# Patient Record
Sex: Male | Born: 1975
Health system: Southern US, Community
[De-identification: ages and names within clinical notes are randomized; demographics above are authoritative.]

---

## 2005-07-03 ENCOUNTER — Emergency Department: Payer: Self-pay | Admitting: Emergency Medicine

## 2006-12-18 ENCOUNTER — Ambulatory Visit: Payer: Self-pay | Admitting: Internal Medicine

## 2006-12-18 LAB — CONVERTED CEMR LAB
ALT: 30 units/L (ref 0–40)
Albumin: 4 g/dL (ref 3.5–5.2)
Alkaline Phosphatase: 50 units/L (ref 39–117)
BUN: 10 mg/dL (ref 6–23)
Basophils Absolute: 0.1 10*3/uL (ref 0.0–0.1)
Basophils Relative: 1 % (ref 0.0–1.0)
Bilirubin Urine: NEGATIVE
Chloride: 105 meq/L (ref 96–112)
Creatinine, Ser: 0.9 mg/dL (ref 0.4–1.5)
Eosinophils Absolute: 0.1 10*3/uL (ref 0.0–0.6)
GFR calc Af Amer: 127 mL/min
GFR calc non Af Amer: 105 mL/min
HDL: 38.3 mg/dL — ABNORMAL LOW (ref >39.0)
Hemoglobin: 16.4 g/dL (ref 13.0–17.0)
LDL Cholesterol: 130 mg/dL — ABNORMAL HIGH (ref 0–99)
Lymphocytes Relative: 16.5 % (ref 12.0–46.0)
MCHC: 35.4 g/dL (ref 30.0–36.0)
Neutrophils Relative %: 74.4 % (ref 43.0–77.0)
Nitrite: NEGATIVE
TSH: 1.08 microintl units/mL (ref 0.35–5.50)
Total CHOL/HDL Ratio: 4.8
Triglycerides: 82 mg/dL (ref 0–149)
Urine Glucose: NEGATIVE mg/dL
VLDL: 16 mg/dL (ref 0–40)
WBC: 7.7 10*3/uL (ref 4.5–10.5)

## 2007-01-01 ENCOUNTER — Emergency Department (HOSPITAL_COMMUNITY): Admission: EM | Admit: 2007-01-01 | Discharge: 2007-01-01 | Payer: Self-pay | Admitting: Emergency Medicine

## 2007-01-28 ENCOUNTER — Ambulatory Visit: Payer: Self-pay | Admitting: Internal Medicine

## 2007-01-29 ENCOUNTER — Encounter: Payer: Self-pay | Admitting: Family Medicine

## 2007-08-05 ENCOUNTER — Encounter: Payer: Self-pay | Admitting: *Deleted

## 2007-12-03 ENCOUNTER — Ambulatory Visit: Payer: Self-pay | Admitting: Internal Medicine

## 2007-12-03 DIAGNOSIS — J385 Laryngeal spasm: Secondary | ICD-10-CM | POA: Insufficient documentation

## 2007-12-03 DIAGNOSIS — J069 Acute upper respiratory infection, unspecified: Secondary | ICD-10-CM | POA: Insufficient documentation

## 2007-12-09 ENCOUNTER — Encounter: Payer: Self-pay | Admitting: Internal Medicine

## 2011-01-10 ENCOUNTER — Encounter: Payer: Self-pay | Admitting: Family Medicine

## 2014-02-06 ENCOUNTER — Ambulatory Visit: Payer: Self-pay | Admitting: Physician Assistant

## 2014-02-06 LAB — RAPID INFLUENZA A&B ANTIGENS

## 2014-10-07 ENCOUNTER — Ambulatory Visit: Payer: Self-pay | Admitting: Family Medicine

## 2014-10-07 LAB — CBC WITH DIFFERENTIAL/PLATELET
BASOS PCT: 0.9 %
Basophil #: 0.1 10*3/uL (ref 0.0–0.1)
EOS ABS: 0 10*3/uL (ref 0.0–0.7)
Eosinophil %: 0.4 %
HCT: 46.9 % (ref 40.0–52.0)
HGB: 15.8 g/dL (ref 13.0–18.0)
LYMPHS ABS: 1.7 10*3/uL (ref 1.0–3.6)
LYMPHS PCT: 16.4 %
MCH: 30.1 pg (ref 26.0–34.0)
MCHC: 33.8 g/dL (ref 32.0–36.0)
MCV: 89 fL (ref 80–100)
MONOS PCT: 8.6 %
Monocyte #: 0.9 x10 3/mm (ref 0.2–1.0)
NEUTROS ABS: 7.6 10*3/uL — AB (ref 1.4–6.5)
NEUTROS PCT: 73.7 %
Platelet: 457 10*3/uL — ABNORMAL HIGH (ref 150–440)
RBC: 5.26 10*6/uL (ref 4.40–5.90)
RDW: 13.6 % (ref 11.5–14.5)
WBC: 10.3 10*3/uL (ref 3.8–10.6)

## 2014-10-07 LAB — BASIC METABOLIC PANEL
ANION GAP: 9 (ref 7–16)
BUN: 10 mg/dL (ref 7–18)
CHLORIDE: 104 mmol/L (ref 98–107)
CO2: 28 mmol/L (ref 21–32)
Calcium, Total: 9.3 mg/dL (ref 8.5–10.1)
Creatinine: 1.16 mg/dL (ref 0.60–1.30)
EGFR (Non-African Amer.): >60
GLUCOSE: 106 mg/dL — AB (ref 65–99)
OSMOLALITY: 281 (ref 275–301)
POTASSIUM: 4.1 mmol/L (ref 3.5–5.1)
SODIUM: 141 mmol/L (ref 136–145)

## 2014-10-07 LAB — TSH: Thyroid Stimulating Horm: 1.2 u[IU]/mL

## 2014-10-12 ENCOUNTER — Ambulatory Visit: Payer: Self-pay | Admitting: Family Medicine

## 2016-01-02 MED FILL — LISINOPRIL 10 MG TABLET: 10 | 30 days supply | Qty: 30 | Fill #0

## 2016-02-13 MED FILL — LISINOPRIL 10 MG TABLET: 10 | 30 days supply | Qty: 30 | Fill #0

## 2016-03-06 DIAGNOSIS — I10 Essential (primary) hypertension: Secondary | ICD-10-CM | POA: Diagnosis not present

## 2016-03-06 DIAGNOSIS — Z0000 Encounter for general adult medical examination without abnormal findings: Secondary | ICD-10-CM | POA: Diagnosis not present

## 2016-03-12 DIAGNOSIS — Z0000 Encounter for general adult medical examination without abnormal findings: Secondary | ICD-10-CM | POA: Diagnosis not present

## 2016-03-12 MED FILL — LISINOPRIL 10 MG TABLET: 10 | 90 days supply | Qty: 90 | Fill #0

## 2016-03-29 DIAGNOSIS — H52223 Regular astigmatism, bilateral: Secondary | ICD-10-CM | POA: Diagnosis not present

## 2016-06-18 MED FILL — LISINOPRIL 10 MG TABLET: 10 | 90 days supply | Qty: 90 | Fill #1

## 2016-09-27 MED FILL — LISINOPRIL 10 MG TABLET: 10 | 90 days supply | Qty: 90 | Fill #2

## 2017-01-11 MED FILL — LISINOPRIL 10 MG TABLET: 10 | 90 days supply | Qty: 90 | Fill #3

## 2017-02-19 DIAGNOSIS — H168 Other keratitis: Secondary | ICD-10-CM | POA: Diagnosis not present

## 2017-04-15 MED FILL — LISINOPRIL 10 MG TABLET: 10 | 30 days supply | Qty: 30 | Fill #0

## 2017-05-23 MED FILL — LISINOPRIL 10 MG TABLET: 10 | 30 days supply | Qty: 30 | Fill #1

## 2017-06-23 MED FILL — LISINOPRIL 10 MG TABLET: 10 | 30 days supply | Qty: 30 | Fill #2

## 2017-06-26 DIAGNOSIS — Z0000 Encounter for general adult medical examination without abnormal findings: Secondary | ICD-10-CM | POA: Diagnosis not present

## 2017-06-26 DIAGNOSIS — I10 Essential (primary) hypertension: Secondary | ICD-10-CM | POA: Diagnosis not present

## 2017-08-05 MED FILL — LISINOPRIL 10 MG TABS: 10 | 90 days supply | Qty: 90 | Fill #0

## 2017-11-18 MED FILL — LISINOPRIL 10 MG TABS: 10 | 90 days supply | Qty: 90 | Fill #1

## 2018-02-20 MED FILL — LISINOPRIL 10 MG TABS: 10 | 90 days supply | Qty: 90 | Fill #2

## 2018-05-26 MED FILL — LISINOPRIL 10 MG TABLET: 10 | 90 days supply | Qty: 90 | Fill #3

## 2018-09-04 MED FILL — LISINOPRIL 10 MG TABS: 10 | 90 days supply | Qty: 90 | Fill #0

## 2018-12-08 MED FILL — LISINOPRIL 10 MG TABLET: 10 | 90 days supply | Qty: 90 | Fill #0

## 2019-03-12 DIAGNOSIS — Z0000 Encounter for general adult medical examination without abnormal findings: Secondary | ICD-10-CM | POA: Diagnosis not present

## 2019-03-12 DIAGNOSIS — Z125 Encounter for screening for malignant neoplasm of prostate: Secondary | ICD-10-CM | POA: Diagnosis not present

## 2019-03-18 DIAGNOSIS — I10 Essential (primary) hypertension: Secondary | ICD-10-CM | POA: Diagnosis not present

## 2019-03-27 MED FILL — LISINOPRIL 10 MG TABLET: 10 | 90 days supply | Qty: 90 | Fill #0

## 2019-07-27 DIAGNOSIS — Z0000 Encounter for general adult medical examination without abnormal findings: Secondary | ICD-10-CM | POA: Diagnosis not present

## 2019-07-27 DIAGNOSIS — I10 Essential (primary) hypertension: Secondary | ICD-10-CM | POA: Diagnosis not present

## 2019-09-07 DIAGNOSIS — I10 Essential (primary) hypertension: Secondary | ICD-10-CM | POA: Diagnosis not present

## 2019-09-07 DIAGNOSIS — Z0000 Encounter for general adult medical examination without abnormal findings: Secondary | ICD-10-CM | POA: Diagnosis not present

## 2020-11-07 ENCOUNTER — Other Ambulatory Visit: Payer: Self-pay | Admitting: Family Medicine

## 2020-12-07 DIAGNOSIS — Z125 Encounter for screening for malignant neoplasm of prostate: Secondary | ICD-10-CM | POA: Diagnosis not present

## 2020-12-07 DIAGNOSIS — Z13220 Encounter for screening for lipoid disorders: Secondary | ICD-10-CM | POA: Diagnosis not present

## 2020-12-07 DIAGNOSIS — Z0000 Encounter for general adult medical examination without abnormal findings: Secondary | ICD-10-CM | POA: Diagnosis not present

## 2020-12-14 ENCOUNTER — Other Ambulatory Visit: Payer: Self-pay | Admitting: Family Medicine

## 2020-12-14 DIAGNOSIS — I10 Essential (primary) hypertension: Secondary | ICD-10-CM | POA: Diagnosis not present

## 2020-12-14 DIAGNOSIS — Z0000 Encounter for general adult medical examination without abnormal findings: Secondary | ICD-10-CM | POA: Diagnosis not present

## 2020-12-21 DIAGNOSIS — Z20822 Contact with and (suspected) exposure to covid-19: Secondary | ICD-10-CM | POA: Diagnosis not present

## 2020-12-21 DIAGNOSIS — U071 COVID-19: Secondary | ICD-10-CM | POA: Diagnosis not present

## 2021-02-07 ENCOUNTER — Other Ambulatory Visit: Payer: Self-pay

## 2021-02-07 MED FILL — Lisinopril Tab 20 MG: ORAL | 90 days supply | Qty: 90 | Fill #0 | Status: AC

## 2021-06-08 MED FILL — Lisinopril Tab 20 MG: ORAL | 90 days supply | Qty: 90 | Fill #1 | Status: AC

## 2021-06-09 ENCOUNTER — Other Ambulatory Visit: Payer: Self-pay

## 2021-07-12 NOTE — Progress Notes (Signed)
Subjective:    CC: L knee pain  I, Jacob Arias, LAT, ATC, am serving as scribe for Dr. Clementeen Graham.  HPI: Pt is a 45 y/o male presenting w/ L knee pain ongoing since after playing soccer w/ his kids that has never full gotten better. Pt made a "cut" this weekend and experienced increased pain. He locates his pain to the anterior-medial aspect and sometimes the posterior aspect of the L knee and into calf.  Knee swelling: no Knee mechanical symptoms: yes Aggravating factors: deep flexion, stairs, transitioning from sitting to standing Treatments tried: ice, IBU, knee brace  Pertinent review of Systems: No fevers or chills  Relevant historical information: History of laryngeal spasm   Objective:    Vitals:   07/13/21 0841  BP: 136/84  Pulse: 86  SpO2: 98%   General: Well Developed, well nourished, and in no acute distress.   MSK: Left knee mild effusion normal motion with crepitation. Tender palpation medial joint line. Stable ligamentous exam. Positive medial McMurray's test. Intact strength.  Lab and Radiology Results  Procedure: Real-time Ultrasound Guided Injection of left knee superior lateral patellar space Device: Philips Affiniti 50G Images permanently stored and available for review in PACS Ultrasound evaluation prior to injection reveals mild effusion.  Intact quad and patellar tendon.  Degenerative appearing medial meniscus.  No Baker's cyst. Verbal informed consent obtained.  Discussed risks and benefits of procedure. Warned about infection bleeding damage to structures skin hypopigmentation and fat atrophy among others. Patient expresses understanding and agreement Time-out conducted.   Noted no overlying erythema, induration, or other signs of local infection.   Skin prepped in a sterile fashion.   Local anesthesia: Topical Ethyl chloride.   With sterile technique and under real time ultrasound guidance: 40 mg of Kenalog and 2 mL of Marcaine  injected into knee joint. Fluid seen entering the joint capsule.   Completed without difficulty   Pain immediately resolved suggesting accurate placement of the medication.   Advised to call if fevers/chills, erythema, induration, drainage, or persistent bleeding.   Images permanently stored and available for review in the ultrasound unit.  Impression: Technically successful ultrasound guided injection.    X-ray left knee images obtained today personally and independently interpreted No acute fractures.  Mild degenerative changes. Await formal radiology review   Impression and Recommendations:    Assessment and Plan: 45 y.o. male with left knee pain occurring since April.  Concerning for degenerative medial meniscus tear.  Plan for steroid injection refer to physical therapy Voltaren gel and compression sleeve.  If not improving within about 6 weeks next step would probably be MRI to further characterize cause of the pain and swelling.  Recheck in about 6 weeks.[name]  PDMP not reviewed this encounter. Orders Placed This Encounter  Procedures   [place] LIMITED JOINT SPACE STRUCTURES LOW LEFT(NO LINKED CHARGES)    Standing Status:   Future    Number of Occurrences:   1    Standing Expiration Date:   01/10/2022    Order Specific Question:   Reason for Exam (SYMPTOM  OR DIAGNOSIS REQUIRED)    Answer:   left knee pain    Order Specific Question:   Preferred imaging location?    Answer:   [facility] Knee AP/LAT W/Sunrise Left    Standing Status:   Future    Number of Occurrences:   1    Standing Expiration Date:   07/13/2022    Order  Specific Question:   Reason for Exam (SYMPTOM  OR DIAGNOSIS REQUIRED)    Answer:   left knee pain    Order Specific Question:   Preferred imaging location?    Answer:   [name]   Ambulatory referral to Physical Therapy    Referral Priority:   Routine    Referral Type:   Physical Medicine    Referral Reason:   Specialty  Services Required    Requested Specialty:   Physical Therapy    Number of Visits Requested:   1   No orders of the defined types were placed in this encounter.   Discussed warning signs or symptoms. Please see discharge instructions. Patient expresses understanding.   The above documentation has been reviewed and is accurate and complete Clementeen Graham, M.D.

## 2021-07-13 ENCOUNTER — Other Ambulatory Visit: Payer: Self-pay

## 2021-07-13 ENCOUNTER — Ambulatory Visit: Payer: Self-pay

## 2021-07-13 ENCOUNTER — Ambulatory Visit (INDEPENDENT_AMBULATORY_CARE_PROVIDER_SITE_OTHER): Payer: 59

## 2021-07-13 ENCOUNTER — Ambulatory Visit (INDEPENDENT_AMBULATORY_CARE_PROVIDER_SITE_OTHER): Payer: 59 | Admitting: Family Medicine

## 2021-07-13 VITALS — BP 136/84 | HR 86 | Ht 74.0 in | Wt 285.8 lb

## 2021-07-13 DIAGNOSIS — M25562 Pain in left knee: Secondary | ICD-10-CM

## 2021-07-13 NOTE — Patient Instructions (Addendum)
Thank you for coming in today.   Please get an Xray today before you leave   I've referred you to Physical Therapy.  Let us know if you don't hear from them in one week.   Call or go to the ER if you develop a large red swollen joint with extreme pain or oozing puss.    Please use Voltaren gel (Generic Diclofenac Gel) up to 4x daily for pain as needed.  This is available over-the-counter as both the name brand Voltaren gel and the generic diclofenac gel.   I recommend you obtained a compression sleeve to help with your joint problems. There are many options on the market however I recommend obtaining a full knee Body Helix compression sleeve.  You can find information (including how to appropriate measure yourself for sizing) can be found at www.[employer].ch.  Many of these products are health savings account (HSA) eligible.   You can use the compression sleeve at any time throughout the day but is most important to use while being active as well as for 2 hours post-activity.   It is appropriate to ice following activity with the compression sleeve in place.    Recheck in about 6 weeks.   Let me know if this is not working.   Next step is probably MRI.  I can order that before you come back if that is obviously what we are doing.

## 2021-07-17 NOTE — Progress Notes (Signed)
Left knee x-ray looks normal to radiology

## 2021-07-21 ENCOUNTER — Other Ambulatory Visit: Payer: Self-pay

## 2021-07-21 ENCOUNTER — Ambulatory Visit: Payer: 59 | Admitting: Physical Therapy

## 2021-07-21 ENCOUNTER — Encounter: Payer: Self-pay | Admitting: Physical Therapy

## 2021-07-21 DIAGNOSIS — M25562 Pain in left knee: Secondary | ICD-10-CM | POA: Diagnosis not present

## 2021-07-21 DIAGNOSIS — R262 Difficulty in walking, not elsewhere classified: Secondary | ICD-10-CM

## 2021-07-21 DIAGNOSIS — M6281 Muscle weakness (generalized): Secondary | ICD-10-CM | POA: Diagnosis not present

## 2021-07-21 NOTE — Patient Instructions (Signed)
Access Code: [email or web address] URL: [email or web address] Date: 07/21/2021 Prepared by: Ivery Quale  Exercises Seated Hamstring Stretch - 2 x daily - 6 x weekly - 1 sets - 3 reps - 30 hold Wall Quarter Squat - 2 x daily - 6 x weekly - 1 sets - 10 reps - 10 sec hold Sit to Stand Without Arm Support - 2 x daily - 6 x weekly - 1-2 sets - 10 reps Sidestepping in Squat with Resistance and Arms Forward - 2 x daily - 6 x weekly - 3 sets - 10 reps Trail Leg Lunge - 2 x daily - 6 x weekly - 2-3 sets - 8-10 reps Single Leg Bridge - 2 x daily - 6 x weekly - 3 sets - 10 reps

## 2021-07-21 NOTE — Therapy (Signed)
[facility] Physical Therapy [address] [place], [place], [address] Phone: [phone]   Fax:  [phone]  Physical Therapy Evaluation  Patient Details  Name: Jacob Arias MRN: [id number] Date of Birth: 1976/10/12 Referring Provider (PT): Denyse Amass, MD   Encounter Date: 07/21/2021   PT End of Session - 07/21/21 1152     Visit Number 1    Number of Visits 6    Date for PT Re-Evaluation 09/01/21    Authorization Type [facility]    PT Start Time 1100    PT Stop Time 1142    PT Time Calculation (min) 42 min    Activity Tolerance Patient tolerated treatment well    Behavior During Therapy [facility] for tasks assessed/performed             History reviewed. No pertinent past medical history.  History reviewed. No pertinent surgical history.  There were no vitals filed for this visit.    Subjective Assessment - 07/21/21 1101     Subjective Pt is a 45 y/o male presenting w/ L knee pain ongoing since april after playing soccer w/ his kids that has never full gotten better. Pt made a "cut" last weekend and experienced increased pain. He locates his pain to the anterior-medial aspect and sometimes the posterior aspect of the L knee and into calf. He did have injection 07/13/21 and now relays he is not having pain at all, but he also has not tested his knee and has not done any activity for fear of the pain coming back.    Diagnostic tests Knee XR negative, knee diagnostic US "mild effusion.  Intact quad and patellar tendon.  Degenerative appearing medial meniscus.  No Baker's cyst."    Currently in Pain? No/denies   but was having severe pain 8-9 before injection   Pain Score 0-No pain    Pain Location Knee    Pain Orientation Left    Pain Type Acute pain    Aggravating Factors  squatting, stairs, prolonged standing or walking    Pain Relieving Factors bike, cortisone injection                [facility] PT Assessment - 07/21/21 0001       Assessment   Medical  Diagnosis Lt knee pain    Referring Provider (PT) Denyse Amass, MD    Onset Date/Surgical Date --   pain since April 2022   Next MD Visit 08/24/21    Prior Therapy none      Precautions   Precautions None      Home Environment   Living Environment Private residence      Prior Function   Level of Independence Independent    Vocation Full time employment    Biomedical scientist, desk work    Leisure golf, hike, soccer with kids      Cognition   Overall Cognitive Status Within Functional Limits for tasks assessed      Observation/Other Assessments   Focus on Therapeutic Outcomes (FOTO)  61% functional score      ROM / Strength   AROM / PROM / Strength AROM;PROM;Strength      AROM   AROM Assessment Site Knee    Right/Left Knee Left;Right    Right Knee Extension 0    Right Knee Flexion 127    Left Knee Extension 0    Left Knee Flexion 125      Strength   Overall Strength Comments bilat hip and knee strength 5/5  except Lt hip extension 4+      Flexibility   Soft Tissue Assessment /Muscle Length --   moderate tight hamstrings, mild tight quads     Special Tests   Other special tests negative special testing for valgus and varus stress tests, anterior-posterior drawer, mcmurrayts test, thessaly test, eges test. No pain with squatting today. +patellar grind test      Ambulation/Gait   Gait Comments WNL, community ambulator                        Objective measurements completed on examination: See above findings.       [facility] Adult PT Treatment/Exercise - 07/21/21 0001       Exercises   Exercises Knee/Hip      Knee/Hip Exercises: Aerobic   Recumbent Bike 8 min L5                     PT Education - 07/21/21 1151     Education Details HEP,POC,exam findings    Person(s) Educated Patient    Methods Explanation;Demonstration;Verbal cues;Handout    Comprehension Verbalized understanding;Need further instruction                  PT Long Term Goals - 07/21/21 1200       PT LONG TERM GOAL #1   Title Pt will improve FOTO to 77% functional score    Time 6    Period Weeks    Status New    Target Date 09/01/21      PT LONG TERM GOAL #2   Title Pt will be able to return to his usual activity and ADL's including golf and soccer with his kids with less than 3/10 overall pain    Time 6    Period Weeks    Status New      PT LONG TERM GOAL #3   Title Pt will be I and compliant with HEP.    Time 6    Period Weeks    Status New                    Plan - 07/21/21 1152     Clinical Impression Statement Pt presents with Lt knee pain from soccer injury while cutting. This appears to have mostly resolved after recieving knee injection last week. He does still lack confidence with his knee and is apprehensive about the pain returning. He would benefit from skilled PT to address this, to build knee stabillity and allow him to slowly return to his normal activity. He will start with HEP and will follow up PT as needed but likely will not need more than one time per 1-2 weeks unless symptoms become worse.    Examination-Activity Limitations Squat;Lift;Stand;Stairs    Examination-Participation Restrictions Community Activity;Yard Work;Other   golf and soccer   Stability/Clinical Decision Making Stable/Uncomplicated    Clinical Decision Making Low    Rehab Potential Excellent    PT Frequency 1x / week    PT Duration 6 weeks    PT Treatment/Interventions ADLs/Self Care Home Management;Cryotherapy;Electrical Stimulation;Iontophoresis 4mg /ml Dexamethasone;Moist Heat;Ultrasound;Therapeutic activities;Therapeutic exercise;Neuromuscular re-education;Patient/family education;Manual techniques;Passive range of motion;Dry needling;Joint Manipulations;Taping;Vasopneumatic Device    PT Next Visit Plan review and progress HEP as able. SL stability, H.S/quad stretching    Consulted and Agree with Plan of Care Patient              Patient will benefit from skilled therapeutic intervention in  order to improve the following deficits and impairments:  Decreased activity tolerance, Decreased strength, Decreased range of motion, Difficulty walking, Impaired flexibility, Pain  Visit Diagnosis: Acute pain of left knee  Difficulty in walking, not elsewhere classified  Muscle weakness (generalized)     Problem List Patient Active Problem List   Diagnosis Date Noted   URI 12/03/2007   LARYNGEAL SPASM 12/03/2007    April Manson, PT,DPT 07/21/2021, 12:02 PM  [facility] Physical Therapy [address] [place], [place], [address] Phone: [phone]   Fax:  [phone]  Name: Jacob Arias MRN: [id number] Date of Birth: 12-10-1975

## 2021-07-25 ENCOUNTER — Ambulatory Visit (HOSPITAL_BASED_OUTPATIENT_CLINIC_OR_DEPARTMENT_OTHER): Payer: Self-pay | Admitting: Orthopaedic Surgery

## 2021-08-07 ENCOUNTER — Other Ambulatory Visit: Payer: Self-pay

## 2021-08-07 ENCOUNTER — Ambulatory Visit: Payer: 59 | Admitting: Physical Therapy

## 2021-08-07 DIAGNOSIS — R262 Difficulty in walking, not elsewhere classified: Secondary | ICD-10-CM

## 2021-08-07 DIAGNOSIS — M25562 Pain in left knee: Secondary | ICD-10-CM

## 2021-08-07 DIAGNOSIS — M6281 Muscle weakness (generalized): Secondary | ICD-10-CM | POA: Diagnosis not present

## 2021-08-07 NOTE — Therapy (Addendum)
[facility] Physical Therapy [address] [place], [place], [address] Phone: [phone]   Fax:  [phone]  Physical Therapy Treatment/Discharge addendum PHYSICAL THERAPY DISCHARGE SUMMARY  Visits from Start of Care: 2  Current functional level related to goals / functional outcomes: See below   Remaining deficits: See below   Education / Equipment: HEP Plan:  Patient goals were not met. Patient is being discharged due to not returning since last visit.      Patient Details  Name: Jacob Arias MRN: [id number] Date of Birth: [birth year] Referring Provider (PT): Georgina Snell, MD   Encounter Date: 08/07/2021   PT End of Session - 08/07/21 1413     Visit Number 2    Number of Visits 6    Date for PT Re-Evaluation 09/01/21    Authorization Type [facility]    PT Start Time 1300    PT Stop Time 1345    PT Time Calculation (min) 45 min    Activity Tolerance Patient tolerated treatment well    Behavior During Therapy [facility] for tasks assessed/performed             No past medical history on file.  No past surgical history on file.  There were no vitals filed for this visit.   Subjective Assessment - 08/07/21 1335     Subjective relays he had to chase down his dog that got loose and this caused the pain to come back but now it is only mild pain overall.    Diagnostic tests Knee XR negative, knee diagnostic US "mild effusion.  Intact quad and patellar tendon.  Degenerative appearing medial meniscus.  No Baker's cyst."    Currently in Pain? Yes    Pain Score 3     Pain Location Knee    Pain Orientation Left                               [facility] Adult PT Treatment/Exercise - 08/07/21 0001       Knee/Hip Exercises: Stretches   Sports administrator Left;20 seconds;4 reps    Sports administrator Limitations supine with strap    Gastroc Stretch Both;3 reps;30 seconds    Gastroc Stretch Limitations slantboard      Knee/Hip Exercises: Aerobic    Recumbent Bike 10 min L5      Knee/Hip Exercises: Machines for Strengthening   Cybex Knee Extension 20# up with both down with Lt only 2X10    Cybex Knee Flexion 45# DL 2X15      Knee/Hip Exercises: Standing   Other Standing Knee Exercises hip abd and marches red band around ankles X20 bilat without UE support    Other Standing Knee Exercises TRX squats with 2 sec hold 2X10, TRX lunges X10 bilat      Knee/Hip Exercises: Seated   Other Seated Knee/Hip Exercises seated SLR 5# 3X10 on Lt                          PT Long Term Goals - 07/21/21 1200       PT LONG TERM GOAL #1   Title Pt will improve FOTO to 77% functional score    Time 6    Period Weeks    Status New    Target Date 09/01/21      PT LONG TERM GOAL #2   Title Pt will be able to return to his usual activity  and ADL's including golf and soccer with his kids with less than 3/10 overall pain    Time 6    Period Weeks    Status New      PT LONG TERM GOAL #3   Title Pt will be I and compliant with HEP.    Time 6    Period Weeks    Status New                   Plan - 08/07/21 1414     Clinical Impression Statement He is seeing some good early progress and has been compliant with HEP. I progressed his strength program and showed him a different quad stretch to do that was more comfortable. He had good overall tolerance to todays exercises and I did recommend a suspension/TRX system for home use. I encouraged him to set up another visit if he wants to so we can progress his home program then.    Examination-Activity Limitations Squat;Lift;Stand;Stairs    Examination-Participation Restrictions Community Activity;Yard Work;Other   golf and soccer   Stability/Clinical Decision Making Stable/Uncomplicated    Rehab Potential Excellent    PT Frequency 1x / week    PT Duration 6 weeks    PT Treatment/Interventions ADLs/Self Care Home Management;Cryotherapy;Electrical Stimulation;Iontophoresis 68m/ml  Dexamethasone;Moist Heat;Ultrasound;Therapeutic activities;Therapeutic exercise;Neuromuscular re-education;Patient/family education;Manual techniques;Passive range of motion;Dry needling;Joint Manipulations;Taping;Vasopneumatic Device    PT Next Visit Plan review and progress HEP as able. SL stability, TRX exercises    Consulted and Agree with Plan of Care Patient             Patient will benefit from skilled therapeutic intervention in order to improve the following deficits and impairments:  Decreased activity tolerance, Decreased strength, Decreased range of motion, Difficulty walking, Impaired flexibility, Pain  Visit Diagnosis: Acute pain of left knee  Difficulty in walking, not elsewhere classified  Muscle weakness (generalized)     Problem List Patient Active Problem List   Diagnosis Date Noted   URI 12/03/2007   LARYNGEAL SPASM 12/03/2007    BDebbe Odea PT,DPT 08/07/2021, 2:20 PM  [facility] [address] [address]Phone: [phone]  Fax:  [phone] Name: Jacob Arias: [id number] of Birth: 903-Jan-1977

## 2021-08-24 ENCOUNTER — Ambulatory Visit: Payer: 59 | Admitting: Family Medicine

## 2021-09-18 MED FILL — Lisinopril Tab 20 MG: ORAL | 90 days supply | Qty: 90 | Fill #2 | Status: AC

## 2021-09-19 ENCOUNTER — Other Ambulatory Visit: Payer: Self-pay

## 2021-11-09 IMAGING — DX DG KNEE AP/LAT W/ SUNRISE*L*
3 series · 3 of 3 positions shown · non-contrast
Comparison: None.

CLINICAL DATA: left knee pain

EXAM:
LEFT KNEE 3 VIEWS

[knee ap]
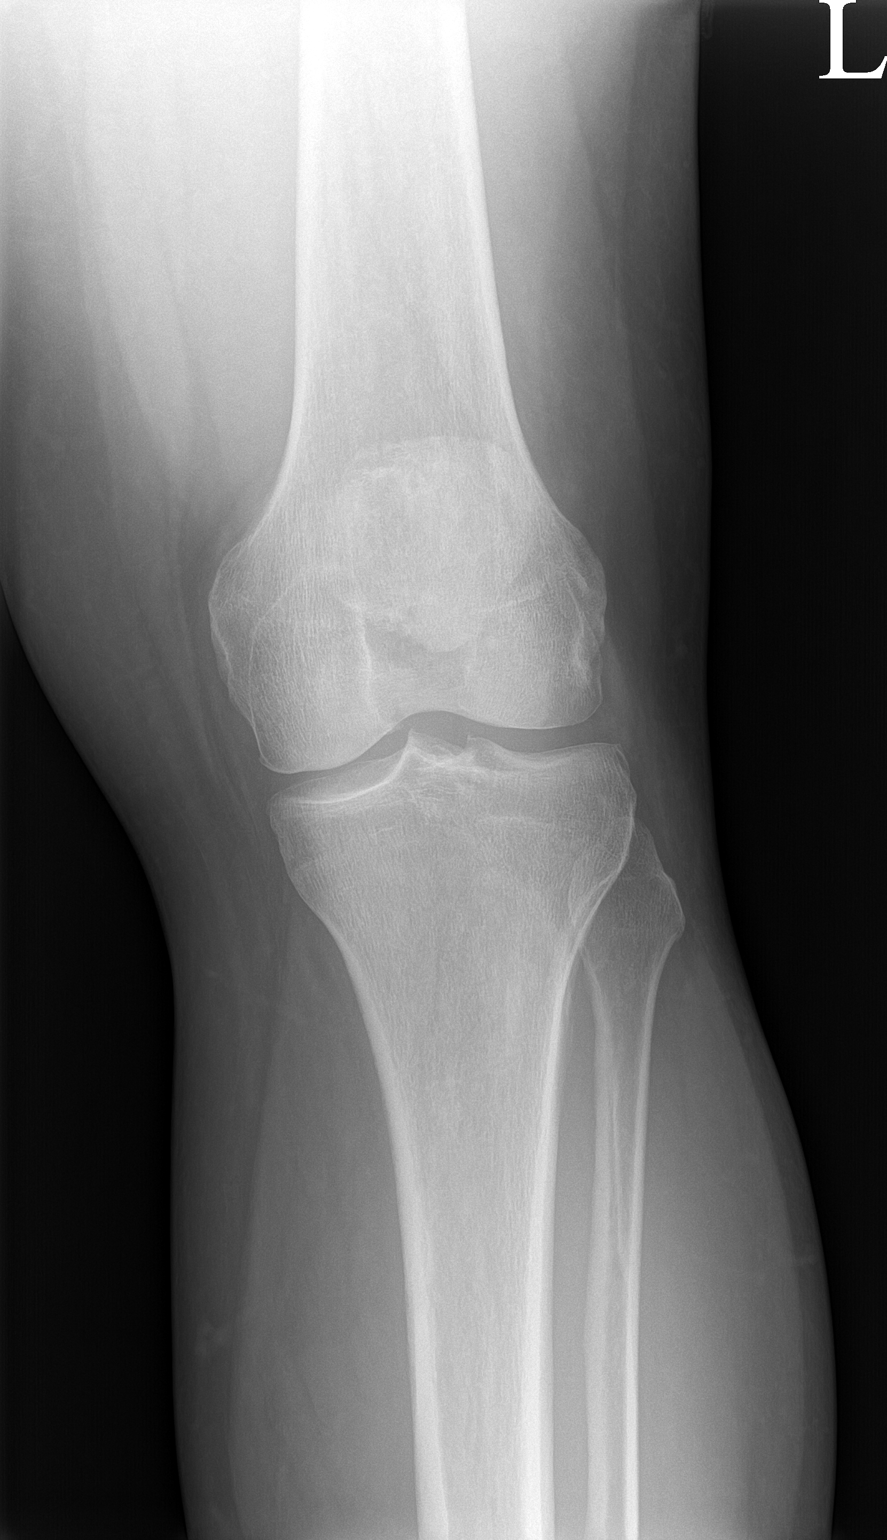

[knee lat]
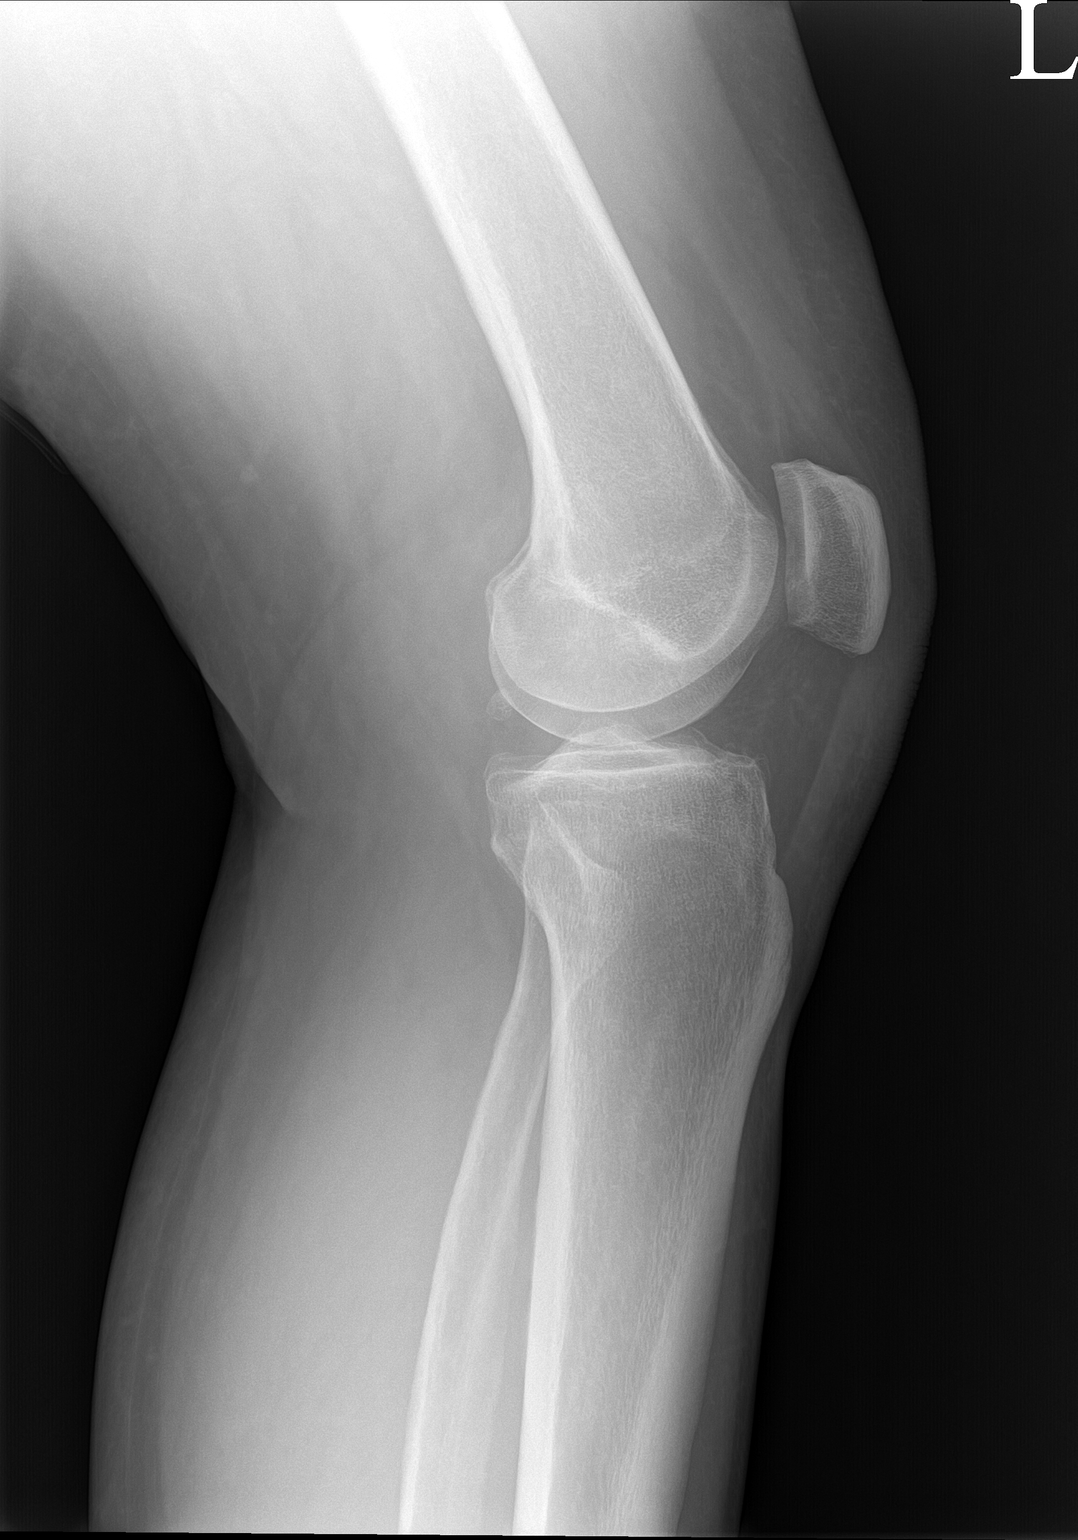

[patella]
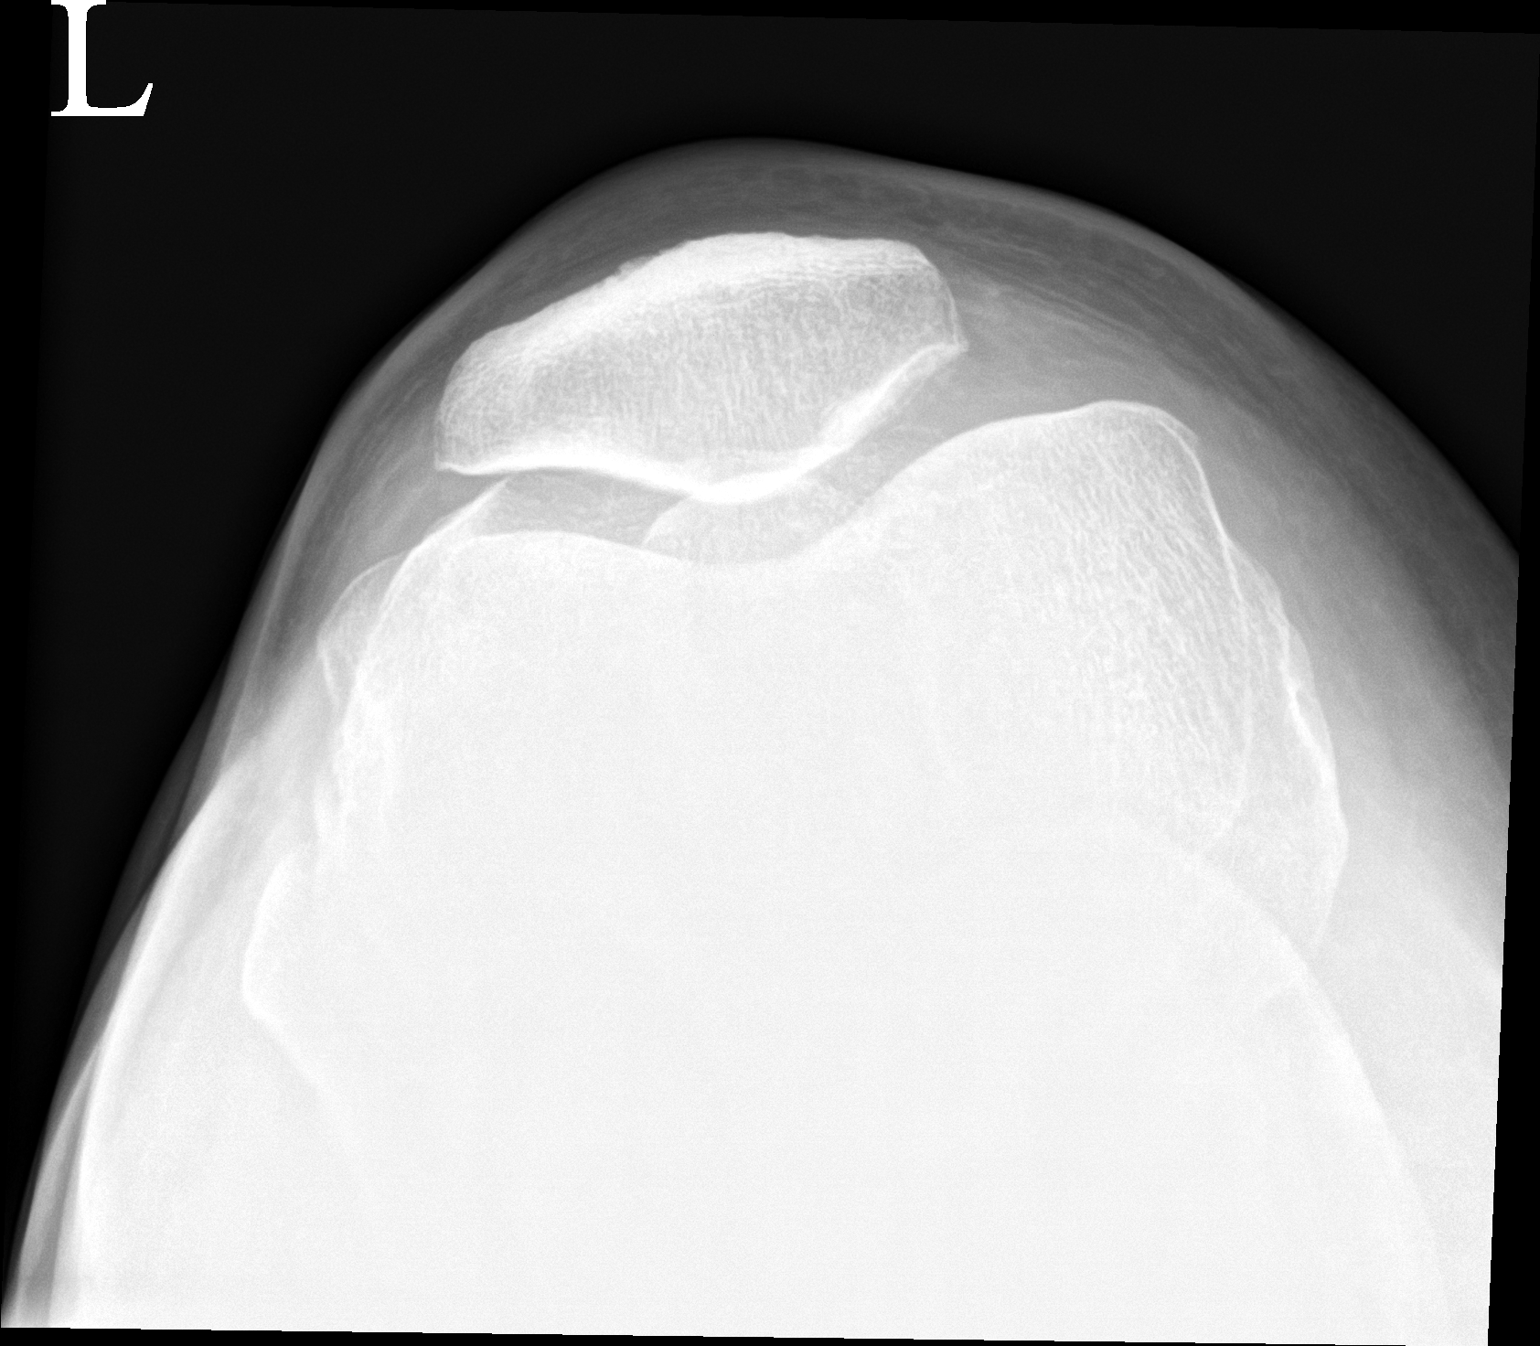

[3 of 3 positions shown; findings below may reference images not displayed]

FINDINGS: No acute fracture or dislocation. Joint spaces and alignment are
maintained. No area of erosion or osseous destruction. No unexpected
radiopaque foreign body. Soft tissues are unremarkable.
IMPRESSION: No acute osseous abnormality.

## 2021-11-20 ENCOUNTER — Other Ambulatory Visit: Payer: Self-pay

## 2021-12-23 ENCOUNTER — Other Ambulatory Visit: Payer: Self-pay

## 2021-12-25 ENCOUNTER — Other Ambulatory Visit: Payer: Self-pay

## 2021-12-25 MED ORDER — LISINOPRIL 20 MG PO TABS
ORAL_TABLET | ORAL | 0 refills | Status: DC
  Filled 2021-12-25: qty 90, 90d supply, fill #0

## 2022-01-30 DIAGNOSIS — Z125 Encounter for screening for malignant neoplasm of prostate: Secondary | ICD-10-CM | POA: Diagnosis not present

## 2022-01-30 DIAGNOSIS — Z13220 Encounter for screening for lipoid disorders: Secondary | ICD-10-CM | POA: Diagnosis not present

## 2022-01-30 DIAGNOSIS — Z0000 Encounter for general adult medical examination without abnormal findings: Secondary | ICD-10-CM | POA: Diagnosis not present

## 2022-02-01 ENCOUNTER — Other Ambulatory Visit: Payer: Self-pay

## 2022-02-01 MED ORDER — ETODOLAC 400 MG PO TABS
400.0000 mg | ORAL_TABLET | Freq: Three times a day (TID) | ORAL | 0 refills | Status: AC
  Filled 2022-02-01: qty 30, 10d supply, fill #0

## 2022-02-01 MED ORDER — CYCLOBENZAPRINE HCL 10 MG PO TABS
10.0000 mg | ORAL_TABLET | Freq: Every day | ORAL | 0 refills | Status: AC
  Filled 2022-02-01: qty 7, 7d supply, fill #0

## 2022-02-06 DIAGNOSIS — Z0000 Encounter for general adult medical examination without abnormal findings: Secondary | ICD-10-CM | POA: Diagnosis not present

## 2022-02-06 DIAGNOSIS — I10 Essential (primary) hypertension: Secondary | ICD-10-CM | POA: Diagnosis not present

## 2022-03-24 ENCOUNTER — Other Ambulatory Visit: Payer: Self-pay

## 2022-03-26 ENCOUNTER — Other Ambulatory Visit: Payer: Self-pay

## 2022-03-26 MED ORDER — LISINOPRIL 20 MG PO TABS
ORAL_TABLET | ORAL | 3 refills | Status: DC
  Filled 2022-03-26: qty 90, 90d supply, fill #0
  Filled 2022-07-03: qty 90, 90d supply, fill #1
  Filled 2022-10-07: qty 90, 90d supply, fill #2

## 2022-07-03 ENCOUNTER — Other Ambulatory Visit: Payer: Self-pay

## 2022-07-03 DIAGNOSIS — M545 Low back pain, unspecified: Secondary | ICD-10-CM | POA: Diagnosis not present

## 2022-07-03 DIAGNOSIS — I10 Essential (primary) hypertension: Secondary | ICD-10-CM | POA: Diagnosis not present

## 2022-07-03 DIAGNOSIS — M62830 Muscle spasm of back: Secondary | ICD-10-CM | POA: Diagnosis not present

## 2022-07-03 MED ORDER — CYCLOBENZAPRINE HCL 5 MG PO TABS
ORAL_TABLET | ORAL | 0 refills | Status: AC
  Filled 2022-07-03: qty 30, 10d supply, fill #0

## 2022-07-24 DIAGNOSIS — M5416 Radiculopathy, lumbar region: Secondary | ICD-10-CM | POA: Diagnosis not present

## 2022-07-27 ENCOUNTER — Ambulatory Visit (HOSPITAL_BASED_OUTPATIENT_CLINIC_OR_DEPARTMENT_OTHER): Payer: 59 | Admitting: Orthopaedic Surgery

## 2022-07-27 ENCOUNTER — Other Ambulatory Visit: Payer: Self-pay

## 2022-07-27 DIAGNOSIS — M545 Low back pain, unspecified: Secondary | ICD-10-CM | POA: Diagnosis not present

## 2022-08-08 DIAGNOSIS — M5416 Radiculopathy, lumbar region: Secondary | ICD-10-CM | POA: Diagnosis not present

## 2022-08-13 DIAGNOSIS — M5416 Radiculopathy, lumbar region: Secondary | ICD-10-CM | POA: Diagnosis not present

## 2022-08-13 DIAGNOSIS — M6281 Muscle weakness (generalized): Secondary | ICD-10-CM | POA: Diagnosis not present

## 2022-08-22 DIAGNOSIS — M5416 Radiculopathy, lumbar region: Secondary | ICD-10-CM | POA: Diagnosis not present

## 2022-08-22 DIAGNOSIS — M6281 Muscle weakness (generalized): Secondary | ICD-10-CM | POA: Diagnosis not present

## 2022-09-05 DIAGNOSIS — M6281 Muscle weakness (generalized): Secondary | ICD-10-CM | POA: Diagnosis not present

## 2022-09-05 DIAGNOSIS — M5416 Radiculopathy, lumbar region: Secondary | ICD-10-CM | POA: Diagnosis not present

## 2022-09-17 DIAGNOSIS — M6281 Muscle weakness (generalized): Secondary | ICD-10-CM | POA: Diagnosis not present

## 2022-09-17 DIAGNOSIS — M5416 Radiculopathy, lumbar region: Secondary | ICD-10-CM | POA: Diagnosis not present

## 2022-10-08 ENCOUNTER — Other Ambulatory Visit: Payer: Self-pay

## 2022-10-11 DIAGNOSIS — M5416 Radiculopathy, lumbar region: Secondary | ICD-10-CM | POA: Diagnosis not present

## 2022-11-22 DIAGNOSIS — I10 Essential (primary) hypertension: Secondary | ICD-10-CM | POA: Diagnosis not present

## 2022-11-22 DIAGNOSIS — M545 Low back pain, unspecified: Secondary | ICD-10-CM | POA: Diagnosis not present

## 2022-12-17 ENCOUNTER — Other Ambulatory Visit: Payer: Self-pay

## 2022-12-17 MED ORDER — LISINOPRIL 20 MG PO TABS
ORAL_TABLET | ORAL | 3 refills | Status: AC
  Filled 2022-12-17: qty 90, fill #0

## 2022-12-18 ENCOUNTER — Other Ambulatory Visit: Payer: Self-pay

## 2022-12-18 MED ORDER — LISINOPRIL 20 MG PO TABS
20.0000 mg | ORAL_TABLET | Freq: Two times a day (BID) | ORAL | 1 refills | Status: DC
  Filled 2022-12-18: qty 180, 90d supply, fill #0

## 2023-01-28 DIAGNOSIS — Z125 Encounter for screening for malignant neoplasm of prostate: Secondary | ICD-10-CM | POA: Diagnosis not present

## 2023-01-28 DIAGNOSIS — Z0000 Encounter for general adult medical examination without abnormal findings: Secondary | ICD-10-CM | POA: Diagnosis not present

## 2023-01-28 DIAGNOSIS — I10 Essential (primary) hypertension: Secondary | ICD-10-CM | POA: Diagnosis not present

## 2023-02-12 ENCOUNTER — Other Ambulatory Visit: Payer: Self-pay

## 2023-02-12 DIAGNOSIS — R0681 Apnea, not elsewhere classified: Secondary | ICD-10-CM | POA: Diagnosis not present

## 2023-02-12 DIAGNOSIS — I10 Essential (primary) hypertension: Secondary | ICD-10-CM | POA: Diagnosis not present

## 2023-02-12 DIAGNOSIS — R0683 Snoring: Secondary | ICD-10-CM | POA: Diagnosis not present

## 2023-02-12 DIAGNOSIS — R519 Headache, unspecified: Secondary | ICD-10-CM | POA: Diagnosis not present

## 2023-02-12 DIAGNOSIS — Z0000 Encounter for general adult medical examination without abnormal findings: Secondary | ICD-10-CM | POA: Diagnosis not present

## 2023-02-12 MED ORDER — LISINOPRIL-HYDROCHLOROTHIAZIDE 20-12.5 MG PO TABS
1.0000 | ORAL_TABLET | Freq: Two times a day (BID) | ORAL | 3 refills | Status: DC
  Filled 2023-02-12: qty 180, 90d supply, fill #0
  Filled 2023-05-12: qty 180, 90d supply, fill #1
  Filled 2023-08-12: qty 180, 90d supply, fill #2
  Filled 2023-11-17: qty 180, 90d supply, fill #3

## 2024-01-02 ENCOUNTER — Encounter: Payer: Self-pay | Admitting: Family Medicine

## 2024-01-02 ENCOUNTER — Other Ambulatory Visit: Payer: Self-pay

## 2024-01-02 ENCOUNTER — Ambulatory Visit (INDEPENDENT_AMBULATORY_CARE_PROVIDER_SITE_OTHER): Payer: 59 | Admitting: Family Medicine

## 2024-01-02 VITALS — BP 137/83 | Ht 74.0 in | Wt 305.0 lb

## 2024-01-02 DIAGNOSIS — M79604 Pain in right leg: Secondary | ICD-10-CM | POA: Diagnosis not present

## 2024-01-02 DIAGNOSIS — M545 Low back pain, unspecified: Secondary | ICD-10-CM

## 2024-01-02 MED ORDER — PREDNISONE 10 MG PO TABS
ORAL_TABLET | ORAL | 0 refills | Status: AC
  Filled 2024-01-02: qty 21, 6d supply, fill #0

## 2024-01-02 NOTE — Patient Instructions (Signed)
 While this could be purely piriformis syndrome your exam suggests it's more from an L5 or S1 radiculopathy that includes that muscle (irritated nerve from your back). Take tylenol for baseline pain relief (1-2 extra strength tabs 3x/day) Take prednisone dose pack as directed. Don't take aleve while on prednisone but ok to take twice a day with food as needed after that. Stay as active as possible. Physical therapy has been shown to be helpful as well. Do the back and piriformis exercises you learned previously in physical therapy If not improving, will consider further imaging (MRI). Follow up with me in 1 month but call me sooner if you're struggling. When you get a chance take a photo of your MRI report and mychart message it to me.

## 2024-01-02 NOTE — Progress Notes (Signed)
 PCP: Jerl Mina, MD  Subjective:   HPI: Patient is a 48 y.o. male here for right hip pain.  Patient reports he has had issues with his posterior right hip since he was about 48 years old. Of note he has done physical therapy about 4-5 times in the past for piriformis syndrome. He states back around Christmas he was kicking a soccer ball with family when he felt pain in the similar area. Pain radiates down the right side of his lower leg and into his toes which go numb from time to time. He does sit for work. No groin pain. No bowel or bladder dysfunction, saddle anesthesia.  History reviewed. No pertinent past medical history.  Current Outpatient Medications on File Prior to Visit  Medication Sig Dispense Refill   cyclobenzaprine (FLEXERIL) 10 MG tablet Take 1 tablet (10 mg total) by mouth daily. 7 tablet 0   cyclobenzaprine (FLEXERIL) 5 MG tablet Take 1 tablet (5 mg total) by mouth 3 (three) times daily as needed for Muscle spasms for up to 10 days 30 tablet 0   etodolac (LODINE) 400 MG tablet Take 1 tablet (400 mg total) by mouth every 8 (eight) hours. 30 tablet 0   lisinopril (ZESTRIL) 20 MG tablet TAKE 1 TABLET BY MOUTH ONCE DAILY 90 tablet 3   lisinopril (ZESTRIL) 20 MG tablet TAKE 1 TABLET BY MOUTH ONCE DAILY 90 tablet 0   lisinopril (ZESTRIL) 20 MG tablet Take 1 tablet (20 mg total) by mouth once daily 90 tablet 3   lisinopril-hydrochlorothiazide (ZESTORETIC) 20-12.5 MG tablet Take 1 tablet by mouth 2 (two) times daily 180 tablet 3   No current facility-administered medications on file prior to visit.    History reviewed. No pertinent surgical history.  Not on File  BP 137/83   Ht 6\' 2"  (1.88 m)   Wt (!) 305 lb (138.3 kg)   BMI 39.16 kg/m       No data to display              No data to display              Objective:  Physical Exam:  Gen: NAD, comfortable in exam room  Back: No gross deformity, scoliosis. TTP over piriformis and external  rotators.  No midline or bony TTP. FROM with pain on flexion. Strength LEs 5/5 all muscle groups.   2+ MSRs in patellar and achilles tendons, equal bilaterally. Positive SLR on right. Sensation intact to light touch bilaterally.  Right hip: No deformity. Full range of motion with 5/5 strength. Tenderness to palpation over piriformis and external rotators. Neurovascularly intact distally. Negative logroll Negative faber, fadir.  Mild discomfort piriformis.    Assessment & Plan:  1. Low back with radiation into right lower extremity: We discussed that while this could be piriformis syndrome, the distribution and associated with numbness and tingling that goes into his toes and his lateral legs suggest that this is more a low lumbar or upper sacral radiculopathy.  He will take prednisone Dosepak as directed.  Tylenol as needed.  Home exercises reviewed though he has done these in the past for his core and piriformis.  Follow-up in 1 month but call sooner if he struggling.

## 2024-01-30 ENCOUNTER — Ambulatory Visit: Payer: 59 | Admitting: Family Medicine

## 2024-02-18 ENCOUNTER — Other Ambulatory Visit: Payer: Self-pay

## 2024-02-19 ENCOUNTER — Other Ambulatory Visit: Payer: Self-pay

## 2024-02-19 MED ORDER — LISINOPRIL-HYDROCHLOROTHIAZIDE 20-12.5 MG PO TABS
1.0000 | ORAL_TABLET | Freq: Two times a day (BID) | ORAL | 0 refills | Status: DC
  Filled 2024-02-19: qty 180, 90d supply, fill #0

## 2024-04-16 DIAGNOSIS — Z125 Encounter for screening for malignant neoplasm of prostate: Secondary | ICD-10-CM | POA: Diagnosis not present

## 2024-04-16 DIAGNOSIS — I10 Essential (primary) hypertension: Secondary | ICD-10-CM | POA: Diagnosis not present

## 2024-04-16 DIAGNOSIS — Z0000 Encounter for general adult medical examination without abnormal findings: Secondary | ICD-10-CM | POA: Diagnosis not present

## 2024-04-23 ENCOUNTER — Other Ambulatory Visit: Payer: Self-pay

## 2024-04-23 DIAGNOSIS — M5442 Lumbago with sciatica, left side: Secondary | ICD-10-CM | POA: Diagnosis not present

## 2024-04-23 DIAGNOSIS — G8929 Other chronic pain: Secondary | ICD-10-CM | POA: Diagnosis not present

## 2024-04-23 DIAGNOSIS — H6122 Impacted cerumen, left ear: Secondary | ICD-10-CM | POA: Diagnosis not present

## 2024-04-23 DIAGNOSIS — I10 Essential (primary) hypertension: Secondary | ICD-10-CM | POA: Diagnosis not present

## 2024-04-23 DIAGNOSIS — Z1331 Encounter for screening for depression: Secondary | ICD-10-CM | POA: Diagnosis not present

## 2024-04-23 DIAGNOSIS — M5441 Lumbago with sciatica, right side: Secondary | ICD-10-CM | POA: Diagnosis not present

## 2024-04-23 DIAGNOSIS — Z0000 Encounter for general adult medical examination without abnormal findings: Secondary | ICD-10-CM | POA: Diagnosis not present

## 2024-04-23 MED ORDER — LISINOPRIL-HYDROCHLOROTHIAZIDE 20-12.5 MG PO TABS
1.0000 | ORAL_TABLET | Freq: Two times a day (BID) | ORAL | 3 refills | Status: AC
  Filled 2024-04-23 – 2024-05-16 (×2): qty 180, 90d supply, fill #0
  Filled 2024-08-20: qty 180, 90d supply, fill #1
  Filled 2024-11-13: qty 180, 90d supply, fill #2

## 2024-05-17 ENCOUNTER — Other Ambulatory Visit: Payer: Self-pay

## 2024-08-21 ENCOUNTER — Other Ambulatory Visit: Payer: Self-pay

## 2024-11-13 ENCOUNTER — Other Ambulatory Visit: Payer: Self-pay
